# Patient Record
Sex: Female | Born: 1940 | Race: Black or African American | Hispanic: No | Smoking: Never smoker
Health system: Southern US, Community
[De-identification: ages and names within clinical notes are randomized; demographics above are authoritative.]

## PROBLEM LIST (undated history)

## (undated) DIAGNOSIS — R9439 Abnormal result of other cardiovascular function study: Secondary | ICD-10-CM

## (undated) DIAGNOSIS — E079 Disorder of thyroid, unspecified: Secondary | ICD-10-CM

## (undated) DIAGNOSIS — I10 Essential (primary) hypertension: Secondary | ICD-10-CM

## (undated) DIAGNOSIS — I639 Cerebral infarction, unspecified: Secondary | ICD-10-CM

---

## 2016-02-19 ENCOUNTER — Encounter (HOSPITAL_COMMUNITY): Payer: Self-pay | Admitting: Emergency Medicine

## 2016-02-19 ENCOUNTER — Emergency Department (HOSPITAL_COMMUNITY): Payer: Medicare Other

## 2016-02-19 ENCOUNTER — Emergency Department (HOSPITAL_COMMUNITY)
Admission: EM | Admit: 2016-02-19 | Discharge: 2016-02-20 | Disposition: A | Discharge status: Home / Self Care | Payer: Medicare Other | Attending: Emergency Medicine | Admitting: Emergency Medicine

## 2016-02-19 DIAGNOSIS — E079 Disorder of thyroid, unspecified: Secondary | ICD-10-CM | POA: Insufficient documentation

## 2016-02-19 DIAGNOSIS — Z79899 Other long term (current) drug therapy: Secondary | ICD-10-CM | POA: Diagnosis not present

## 2016-02-19 DIAGNOSIS — I10 Essential (primary) hypertension: Secondary | ICD-10-CM | POA: Insufficient documentation

## 2016-02-19 DIAGNOSIS — Z8673 Personal history of transient ischemic attack (TIA), and cerebral infarction without residual deficits: Secondary | ICD-10-CM | POA: Diagnosis not present

## 2016-02-19 DIAGNOSIS — Z7982 Long term (current) use of aspirin: Secondary | ICD-10-CM | POA: Diagnosis not present

## 2016-02-19 DIAGNOSIS — R079 Chest pain, unspecified: Secondary | ICD-10-CM | POA: Diagnosis present

## 2016-02-19 HISTORY — DX: Disorder of thyroid, unspecified: E07.9

## 2016-02-19 HISTORY — DX: Essential (primary) hypertension: I10

## 2016-02-19 HISTORY — DX: Cerebral infarction, unspecified: I63.9

## 2016-02-19 LAB — D-DIMER, QUANTITATIVE (NOT AT ARMC): D DIMER QUANT: 0.43 ug{FEU}/mL (ref 0.00–0.50)

## 2016-02-19 LAB — BASIC METABOLIC PANEL
Anion gap: 9 (ref 5–15)
BUN: 27 mg/dL — AB (ref 6–20)
CO2: 21 mmol/L — ABNORMAL LOW (ref 22–32)
Calcium: 8.9 mg/dL (ref 8.9–10.3)
Chloride: 111 mmol/L (ref 101–111)
Creatinine, Ser: 1.14 mg/dL — ABNORMAL HIGH (ref 0.44–1.00)
GFR calc Af Amer: 54 mL/min — ABNORMAL LOW (ref >60)
GFR, EST NON AFRICAN AMERICAN: 47 mL/min — AB (ref >60)
GLUCOSE: 97 mg/dL (ref 65–99)
POTASSIUM: 4 mmol/L (ref 3.5–5.1)
Sodium: 141 mmol/L (ref 135–145)

## 2016-02-19 LAB — I-STAT TROPONIN, ED: Troponin i, poc: 0 ng/mL (ref 0.00–0.08)

## 2016-02-19 LAB — CBC
HEMATOCRIT: 30.9 % — AB (ref 36.0–46.0)
Hemoglobin: 10.1 g/dL — ABNORMAL LOW (ref 12.0–15.0)
MCH: 25.5 pg — ABNORMAL LOW (ref 26.0–34.0)
MCHC: 32.7 g/dL (ref 30.0–36.0)
MCV: 78 fL (ref 78.0–100.0)
Platelets: 168 10*3/uL (ref 150–400)
RBC: 3.96 MIL/uL (ref 3.87–5.11)
RDW: 14.6 % (ref 11.5–15.5)
WBC: 5.4 10*3/uL (ref 4.0–10.5)

## 2016-02-19 LAB — TROPONIN I: Troponin I: <0.03 ng/mL (ref <0.031)

## 2016-02-19 MED ORDER — LABETALOL HCL 200 MG PO TABS
100.0000 mg | ORAL_TABLET | Freq: Once | ORAL | Status: AC
  Administered 2016-02-19: 100 mg via ORAL
  Filled 2016-02-19: qty 1

## 2016-02-19 NOTE — ED Notes (Signed)
Per EMS, pt just [place] back from [place] today to [place], and then drove back to [place], upon arrival at home she began to experience chest "tightness".  EMS gave her 324 ASA and 1 nitro that took her pain from a 5 to a 3.  Hx of stroke and HNT

## 2016-02-19 NOTE — ED Provider Notes (Signed)
CSN: [id number]     Arrival date & time 02/19/16  1919 History   First MD Initiated Contact with Patient 02/19/16 1929     Chief Complaint  Patient presents with  . Chest Pain     (Consider location/radiation/quality/duration/timing/severity/associated sxs/prior Treatment) HPI Comments: Patient is a 75 year old female with history of hypertension, hemorrhagic CVA, and thyroid disease. She presents for evaluation of chest discomfort. This started this evening after she returned home from a plane trip. She reports flying from [place] to [place], then driving from [place] to [place]. Shortly after arriving home, she spoke on the phone with her daughter and this is when her symptoms began. She describes her discomfort as a heaviness as if something sitting on her chest with no associated shortness of breath, nausea, diaphoresis, or radiation to the arm or jaw. This lasted for approximately an hour, then resolved after receiving aspirin and nitroglycerin by EMS.  She has no prior cardiac history. Her cardiac risk factors include hypertension.  Patient is a 75 y.o. female presenting with chest pain. The history is provided by the patient.  Chest Pain Pain location:  Substernal area Pain quality comment:  Heaviness Pain radiates to:  Does not radiate Pain radiates to the back: no   Pain severity:  Moderate Onset quality:  Sudden Duration:  1 hour Timing:  Constant Progression:  Resolved Chronicity:  New Relieved by:  Nitroglycerin Worsened by:  Nothing tried Ineffective treatments:  None tried Associated symptoms: no abdominal pain, no diaphoresis, no fever, no lower extremity edema, no nausea, no palpitations and no shortness of breath     Past Medical History  Diagnosis Date  . Hypertension   . Stroke (HCC)   . Thyroid disease    History reviewed. No pertinent past surgical history. No family history on file. Social History  Substance Use Topics  . Smoking status: None  .  Smokeless tobacco: None  . Alcohol Use: None   OB History    No data available     Review of Systems  Constitutional: Negative for fever and diaphoresis.  Respiratory: Negative for shortness of breath.   Cardiovascular: Positive for chest pain. Negative for palpitations.  Gastrointestinal: Negative for nausea and abdominal pain.  All other systems reviewed and are negative.     Allergies  Sulfa antibiotics  Home Medications   Prior to Admission medications   Medication Sig Start Date End Date Taking? Authorizing Provider  aspirin 81 MG tablet Take 81 mg by mouth daily.   Yes Historical Provider, MD  atorvastatin (LIPITOR) 40 MG tablet Take 40 mg by mouth daily.   Yes Historical Provider, MD  carvedilol (COREG) 25 MG tablet Take 25 mg by mouth 2 (two) times daily with a meal.   Yes Historical Provider, MD  levothyroxine (SYNTHROID, LEVOTHROID) 50 MCG tablet Take 50 mcg by mouth daily before breakfast.   Yes Historical Provider, MD  losartan (COZAAR) 50 MG tablet Take 50 mg by mouth 2 (two) times daily.   Yes Historical Provider, MD  NIFEdipine (PROCARDIA XL/ADALAT-CC) 90 MG 24 hr tablet Take 90 mg by mouth daily.   Yes Historical Provider, MD   There were no vitals taken for this visit. Physical Exam  Constitutional: She is oriented to person, place, and time. She appears well-developed and well-nourished. No distress.  HENT:  Head: Normocephalic and atraumatic.  Neck: Normal range of motion. Neck supple.  Cardiovascular: Normal rate and regular rhythm.  Exam reveals no gallop and no friction  rub.   No murmur heard. Pulmonary/Chest: Effort normal and breath sounds normal. No respiratory distress. She has no wheezes.  Abdominal: Soft. Bowel sounds are normal. She exhibits no distension. There is no tenderness.  Musculoskeletal: Normal range of motion. She exhibits no edema.  There is no edema or calf swelling. There is no calf tenderness and Homans sign is absent bilaterally.   Neurological: She is alert and oriented to person, place, and time.  Skin: Skin is warm and dry. She is not diaphoretic.  Nursing note and vitals reviewed.   ED Course  Procedures (including critical care time) Labs Review Labs Reviewed  BASIC METABOLIC PANEL  CBC  TROPONIN I  D-DIMER, QUANTITATIVE (NOT AT [facility])    Imaging Review No results found. I have personally reviewed and evaluated these images and lab results as part of my medical decision-making.   EKG Interpretation   Date/Time:  Wednesday February 19 2016 19:26:43 EDT Ventricular Rate:  57 PR Interval:  178 QRS Duration: 106 QT Interval:  455 QTC Calculation: 443 R Axis:   -32 Text Interpretation:  Sinus rhythm Abnormal R-wave progression, early  transition Left ventricular hypertrophy Nonspecific T abnormalities,  diffuse leads Confirmed by Kao Berkheimer  MD, Shakerra Red ([id number]) on 02/19/2016 7:31:12  PM      MDM   Final diagnoses:  None    Patient presents here with complaints of chest discomfort that started after arriving home from a plane trip. She has a negative d-dimer, EKG showing no acute change, and negative troponin 2. She has been pain-free throughout the emergency department stay. On multiple occasions, the patient requested to be discharged prior to the results of her tests. I was able to convince her to stay for the second troponin which did return negative. She will be discharged with instructions to follow-up with cardiology to discuss a stress test and return to the ER if her symptoms significantly worsen or change.    Geoffery Lyonsouglas Khye Hochstetler, MD 02/20/16 [phone]

## 2016-02-19 NOTE — Discharge Instructions (Signed)
Follow-up with cardiology to discuss a possible stress test. The number for the [facility] cardi[facility] clinic has been provided in this discharge summary for you to call and arrange this appointment.  Turn to the emergency department if symptoms significantly worsen or change.   Nonspecific Chest Pain  Chest pain can be caused by many different conditions. There is always a chance that your pain could be related to something serious, such as a heart attack or a blood clot in your lungs. Chest pain can also be caused by conditions that are not life-threatening. If you have chest pain, it is very important to follow up with your health care provider. CAUSES  Chest pain can be caused by:  Heartburn.  Pneumonia or bronchitis.  Anxiety or stress.  Inflammation around your heart (pericarditis) or lung (pleuritis or pleurisy).  A blood clot in your lung.  A collapsed lung (pneumothorax). It can develop suddenly on its own (spontaneous pneumothorax) or from trauma to the chest.  Shingles infection (varicella-zoster virus).  Heart attack.  Damage to the bones, muscles, and cartilage that make up your chest wall. This can include:  Bruised bones due to injury.  Strained muscles or cartilage due to frequent or repeated coughing or overwork.  Fracture to one or more ribs.  Sore cartilage due to inflammation (costochondritis). RISK FACTORS  Risk factors for chest pain may include:  Activities that increase your risk for trauma or injury to your chest.  Respiratory infections or conditions that cause frequent coughing.  Medical conditions or overeating that can cause heartburn.  Heart disease or family history of heart disease.  Conditions or health behaviors that increase your risk of developing a blood clot.  Having had chicken pox (varicella zoster). SIGNS AND SYMPTOMS Chest pain can feel like:  Burning or tingling on the surface of your chest or deep in your  chest.  Crushing, pressure, aching, or squeezing pain.  Dull or sharp pain that is worse when you move, cough, or take a deep breath.  Pain that is also felt in your back, neck, shoulder, or arm, or pain that spreads to any of these areas. Your chest pain may come and go, or it may stay constant. DIAGNOSIS Lab tests or other studies may be needed to find the cause of your pain. Your health care provider may have you take a test called an ambulatory ECG (electrocardiogram). An ECG records your heartbeat patterns at the time the test is performed. You may also have other tests, such as:  Transthoracic echocardiogram (TTE). During echocardiography, sound waves are used to create a picture of all of the heart structures and to look at how blood flows through your heart.  Transesophageal echocardiogram (TEE).This is a more advanced imaging test that obtains images from inside your body. It allows your health care provider to see your heart in finer detail.  Cardiac monitoring. This allows your health care provider to monitor your heart rate and rhythm in real time.  Holter monitor. This is a portable device that records your heartbeat and can help to diagnose abnormal heartbeats. It allows your health care provider to track your heart activity for several days, if needed.  Stress tests. These can be done through exercise or by taking medicine that makes your heart beat more quickly.  Blood tests.  Imaging tests. TREATMENT  Your treatment depends on what is causing your chest pain. Treatment may include:  Medicines. These may include:  Acid blockers for heartburn.  Anti-inflammatory  medicine.  Pain medicine for inflammatory conditions.  Antibiotic medicine, if an infection is present.  Medicines to dissolve blood clots.  Medicines to treat coronary artery disease.  Supportive care for conditions that do not require medicines. This may include:  Resting.  Applying heat or cold  packs to injured areas.  Limiting activities until pain decreases. HOME CARE INSTRUCTIONS  If you were prescribed an antibiotic medicine, finish it all even if you start to feel better.  Avoid any activities that bring on chest pain.  Do not use any tobacco products, including cigarettes, chewing tobacco, or electronic cigarettes. If you need help quitting, ask your health care provider.  Do not drink alcohol.  Take medicines only as directed by your health care provider.  Keep all follow-up visits as directed by your health care provider. This is important. This includes any further testing if your chest pain does not go away.  If heartburn is the cause for your chest pain, you may be told to keep your head raised (elevated) while sleeping. This reduces the chance that acid will go from your stomach into your esophagus.  Make lifestyle changes as directed by your health care provider. These may include:  Getting regular exercise. Ask your health care provider to suggest some activities that are safe for you.  Eating a heart-healthy diet. A registered dietitian can help you to learn healthy eating options.  Maintaining a healthy weight.  Managing diabetes, if necessary.  Reducing stress. SEEK MEDICAL CARE IF:  Your chest pain does not go away after treatment.  You have a rash with blisters on your chest.  You have a fever. SEEK IMMEDIATE MEDICAL CARE IF:   Your chest pain is worse.  You have an increasing cough, or you cough up blood.  You have severe abdominal pain.  You have severe weakness.  You faint.  You have chills.  You have sudden, unexplained chest discomfort.  You have sudden, unexplained discomfort in your arms, back, neck, or jaw.  You have shortness of breath at any time.  You suddenly start to sweat, or your skin gets clammy.  You feel nauseous or you vomit.  You suddenly feel light-headed or dizzy.  Your heart begins to beat quickly, or  it feels like it is skipping beats. These symptoms may represent a serious problem that is an emergency. Do not wait to see if the symptoms will go away. Get medical help right away. Call your local emergency services (911 in the [place] Do not drive yourself to the hospital.   This information is not intended to replace advice given to you by your health care provider. Make sure you discuss any questions you have with your health care provider.   Document Released: 08/12/2005 Document Revised: 11/23/2014 Document Reviewed: 06/08/2014 Elsevier Interactive Patient Education [employer].

## 2016-02-19 NOTE — ED Notes (Signed)
MD notified of BP>200.

## 2016-02-26 ENCOUNTER — Ambulatory Visit (INDEPENDENT_AMBULATORY_CARE_PROVIDER_SITE_OTHER): Payer: Medicare Other | Admitting: Cardiology

## 2016-02-26 ENCOUNTER — Encounter: Payer: Self-pay | Admitting: Cardiology

## 2016-02-26 VITALS — BP 142/82 | HR 58 | Ht 66.0 in | Wt 216.2 lb

## 2016-02-26 DIAGNOSIS — R079 Chest pain, unspecified: Secondary | ICD-10-CM

## 2016-02-26 NOTE — Progress Notes (Signed)
Cardiology Office Note   Date:  02/26/2016   ID:  Samantha Simon, DOB 05/03/1941, MRN [id number]  PCP:  No primary care provider on file.  Cardiologist:   Antonyo Hinderer Jorja LoaMartin Anniece Bleiler, MD    Chief Complaint  Patient presents with  . Chest Pain     History of Present Illness: Samantha Simon is a 75 y.o. female who presents today for cardiology evaluation.   She has a history of HTN, hemorrhagic CVA, and thyroid disease.  She presented to the ER on 4/5 with chest pain.  The pain occurred when she was returning home from a plane trip.  When she arrived at home, she had heaviness kn her chest without nausea, diaphoresis, or radiation to the arm or jaw.  The pain lasted an hour.  In the ER, she had troponin negative x2.  Today she feels well. She has not had any recurrence of her chest pain. She says that she thinks that the chest pain was due to missing medications when she was on vacation. She does say that she took her carvedilol and losartan when she was on vacation. She says that the pain was a pressure type sensation in the middle of her chest associated with shortness of breath. There were no exacerbating or alleviating factors. She actually says that when she received nitroglycerin in the emergency room it did not help her pain.  Today, she denies symptoms of palpitations, orthopnea, PND, lower extremity edema, claudication, dizziness, presyncope, syncope, bleeding, or neurologic sequela. The patient is tolerating medications without difficulties and is otherwise without complaint today.    Past Medical History  Diagnosis Date  . Hypertension   . Stroke (HCC)   . Thyroid disease    No past surgical history on file.   Current Outpatient Prescriptions  Medication Sig Dispense Refill  . aspirin 81 MG tablet Take 81 mg by mouth daily.    Marland Kitchen. atorvastatin (LIPITOR) 40 MG tablet Take 40 mg by mouth daily.    . carvedilol (COREG) 25 MG tablet Take 25 mg by mouth 2 (two) times daily with a meal.     . levothyroxine (SYNTHROID, LEVOTHROID) 50 MCG tablet Take 50 mcg by mouth daily before breakfast.    . losartan (COZAAR) 50 MG tablet Take 50 mg by mouth 2 (two) times daily.    Marland Kitchen. NIFEdipine (PROCARDIA XL/ADALAT-CC) 90 MG 24 hr tablet Take 90 mg by mouth daily.     No current facility-administered medications for this visit.    Allergies:   Sulfa antibiotics   Social History:  The patient  reports that she has never smoked. She does not have any smokeless tobacco history on file. She reports that she drinks alcohol. She reports that she does not use illicit drugs.   Family History:  The patient's family history includes Fainting in her mother; Heart attack in her father; Hypertension in her father; Stroke in her maternal grandfather.    ROS:  Please see the history of present illness.   Otherwise, review of systems is positive for back pain, headaches.   All other systems are reviewed and negative.    PHYSICAL EXAM: VS:  BP 142/82 mmHg  Pulse 58  Ht 5\' 6"  (1.676 m)  Wt 216 lb 3.2 oz (98.068 kg)  BMI 34.91 kg/m2 , BMI Body mass index is 34.91 kg/(m^2). GEN: Well nourished, well developed, in no acute distress HEENT: normal Neck: no JVD, carotid bruits, or masses Cardiac: RRR; no rubs, or gallops,no edema, 2/6  systolic murmur at the base Respiratory:  clear to auscultation bilaterally, normal work of breathing GI: soft, nontender, nondistended, + BS MS: no deformity or atrophy Skin: warm and dry Neuro:  Strength and sensation are intact Psych: euthymic mood, full affect  EKG:  EKG is not ordered today. The ekg ordered 4/7 shows sinus rhythm, PRWP, inferior TWI  Recent Labs: 02/19/2016: BUN 27*; Creatinine, Ser 1.14*; Hemoglobin 10.1*; Platelets 168; Potassium 4.0; Sodium 141    Lipid Panel  No results found for: CHOL, TRIG, HDL, CHOLHDL, VLDL, LDLCALC, LDLDIRECT   Wt Readings from Last 3 Encounters:  02/26/16 216 lb 3.2 oz (98.068 kg)   ASSESSMENT AND PLAN:  1.   Chest pain:the chest pain has some both typical and atypical features. It is associated with both chest heaviness and shortness of breath. It is not relieved by nitroglycerin. Due to the both typical and atypical features, we'll order a nuclear stress test to determine if she does have coronary disease.   Current medicines are reviewed at length with the patient today.   The patient does not have concerns regarding her medicines.  The following changes were made today:  none  Labs/ tests ordered today include:  Orders Placed This Encounter  Procedures  . Myocardial Perfusion Imaging     Disposition:   FU with Tirth Cothron pending nuclear study  Signed, Harvie Morua Jorja Loa, MD  02/26/2016 12:32 PM     [facility] [address] [place] [address] [phone] (office) [phone] (fax)

## 2016-02-26 NOTE — Patient Instructions (Signed)
Medication Instructions:  The current medical regimen is effective;  continue present plan and medications.  Testing/Procedures: Your physician has requested that you have a lexiscan myoview. For further information please visit [email or web address]. Please follow instruction sheet, as given.  Follow-Up: Follow up as needed after testing.  If you need a refill on your cardiac medications before your next appointment, please call your pharmacy.  Thank you for choosing [facility]!!      

## 2016-03-02 ENCOUNTER — Ambulatory Visit: Payer: Medicare Other | Admitting: Cardiology

## 2016-03-03 ENCOUNTER — Telehealth (HOSPITAL_COMMUNITY): Payer: Self-pay | Admitting: *Deleted

## 2016-03-03 NOTE — Telephone Encounter (Signed)
Patient given detailed instructions per Myocardial Perfusion Study Information Sheet for the test on 03/06/16. Patient notified to arrive 15 minutes early and that it is imperative to arrive on time for appointment to keep from having the test rescheduled.  If you need to cancel or reschedule your appointment, please call the office within 24 hours of your appointment. Failure to do so may result in a cancellation of your appointment, and a $50 no show fee. Patient verbalized understanding. Khalessi Blough J Normalee Sistare, RN  

## 2016-03-06 ENCOUNTER — Ambulatory Visit (HOSPITAL_COMMUNITY): Payer: Medicare Other | Attending: Cardiology

## 2016-03-06 DIAGNOSIS — I10 Essential (primary) hypertension: Secondary | ICD-10-CM | POA: Diagnosis not present

## 2016-03-06 DIAGNOSIS — R079 Chest pain, unspecified: Secondary | ICD-10-CM | POA: Diagnosis present

## 2016-03-06 DIAGNOSIS — R9439 Abnormal result of other cardiovascular function study: Secondary | ICD-10-CM | POA: Diagnosis not present

## 2016-03-06 DIAGNOSIS — R0602 Shortness of breath: Secondary | ICD-10-CM | POA: Insufficient documentation

## 2016-03-06 LAB — MYOCARDIAL PERFUSION IMAGING
CHL CUP NUCLEAR SRS: 2
CHL CUP RESTING HR STRESS: 68 {beats}/min
LV sys vol: 88 mL
LVDIAVOL: 150 mL (ref 46–106)
NUC STRESS TID: 0.95
Peak HR: 81 {beats}/min
RATE: 0.26
SDS: 7
SSS: 9

## 2016-03-06 MED ORDER — TECHNETIUM TC 99M SESTAMIBI GENERIC - CARDIOLITE
32.3000 | Freq: Once | INTRAVENOUS | Status: AC | PRN
  Administered 2016-03-06: 32.3 via INTRAVENOUS

## 2016-03-06 MED ORDER — TECHNETIUM TC 99M SESTAMIBI GENERIC - CARDIOLITE
10.2000 | Freq: Once | INTRAVENOUS | Status: AC | PRN
  Administered 2016-03-06: 10 via INTRAVENOUS

## 2016-03-06 MED ORDER — REGADENOSON 0.4 MG/5ML IV SOLN
0.4000 mg | Freq: Once | INTRAVENOUS | Status: AC
  Administered 2016-03-06: 0.4 mg via INTRAVENOUS

## 2016-03-13 ENCOUNTER — Telehealth: Payer: Self-pay | Admitting: Cardiology

## 2016-03-13 NOTE — Telephone Encounter (Signed)
ERROR

## 2016-03-22 NOTE — Progress Notes (Signed)
Cardiology Office Note   Date:  03/23/2016   ID:  Samantha Simon, DOB August 12, 1941, MRN [id number]  PCP:  No primary care provider on file.  Cardiologist:   Samantha Wooden Jorja Loa, MD    Chief Complaint  Patient presents with  . Follow-up    discuss cath     History of Present Illness: Samantha Simon is a 75 y.o. female who presents today for cardiology evaluation.   She has a history of HTN, hemorrhagic CVA, and thyroid disease.  She had a stress myoview showing EF 41% with possible apical infarct vs diaphragmatic attenuation.  She has been feeling well since last being seen, but she does continue to have episodes of chest pain or similar to her chest pain prior to her stress test.  Today, she denies symptoms of palpitations, orthopnea, PND, lower extremity edema, claudication, dizziness, presyncope, syncope, bleeding, or neurologic sequela. The patient is tolerating medications without difficulties and is otherwise without complaint today.    Past Medical History  Diagnosis Date  . Hypertension   . Stroke (HCC)   . Thyroid disease    No past surgical history on file.   Current Outpatient Prescriptions  Medication Sig Dispense Refill  . aspirin 81 MG tablet Take 81 mg by mouth daily.    Marland Kitchen atorvastatin (LIPITOR) 40 MG tablet Take 40 mg by mouth daily.    . carvedilol (COREG) 25 MG tablet Take 25 mg by mouth 2 (two) times daily with a meal.    . levothyroxine (SYNTHROID, LEVOTHROID) 50 MCG tablet Take 50 mcg by mouth daily before breakfast.    . losartan (COZAAR) 50 MG tablet Take 50 mg by mouth 2 (two) times daily.    Marland Kitchen NIFEdipine (PROCARDIA XL/ADALAT-CC) 90 MG 24 hr tablet Take 90 mg by mouth daily.     No current facility-administered medications for this visit.    Allergies:   Sulfa antibiotics   Social History:  The patient  reports that she has never smoked. She does not have any smokeless tobacco history on file. She reports that she drinks alcohol. She reports that  she does not use illicit drugs.   Family History:  The patient's family history includes Fainting in her mother; Heart attack in her father; Hypertension in her father; Stroke in her maternal grandfather.    ROS:  Please see the history of present illness.   Otherwise, review of systems is positive for back pain, chest pain, neurology headaches.   All other systems are reviewed and negative.    PHYSICAL EXAM: VS:  BP 172/90 mmHg  Pulse 64  Ht  (1.651 m)  Wt 216 lb (97.977 kg)  BMI 35.94 kg/m2 , BMI Body mass index is 35.94 kg/(m^2). GEN: Well nourished, well developed, in no acute distress HEENT: normal Neck: no JVD, carotid bruits, or masses Cardiac: RRR; no rubs, or gallops,no edema, 2/6 systolic Simon at the base Respiratory:  clear to auscultation bilaterally, normal work of breathing GI: soft, nontender, nondistended, + BS MS: no deformity or atrophy Skin: warm and dry Neuro:  Strength and sensation are intact Psych: euthymic mood, full affect  EKG:  EKG is not ordered today. The ekg ordered 4/7 shows sinus rhythm, PRWP, inferior TWI  Recent Labs: 02/19/2016: BUN 27*; Creatinine, Ser 1.14*; Hemoglobin 10.1*; Platelets 168; Potassium 4.0; Sodium 141    Lipid Panel  No results found for: CHOL, TRIG, HDL, CHOLHDL, VLDL, LDLCALC, LDLDIRECT   Wt Readings from Last 3 Encounters:  03/23/16  216 lb (97.977 kg)  02/26/16 216 lb 3.2 oz (98.068 kg)   SPECT 03/06/16  Nuclear stress EF: 41%.  There was no ST segment deviation noted during stress.  This is an intermediate risk study.  The left ventricular ejection fraction is moderately decreased (30-44%).  ASSESSMENT AND PLAN:  1.  Chest pain: SPECT with possible infarct vs diaphragm attenuation. Due to the fact that she has a decreased EF on her SPECT, we'll get an echocardiogram to further delineate. She also has a Simon and that Samantha Simon. She is continuing to have chest  discomfort that is similar to her prior episodes of chest discomfort. She had an intermediate risk study with an apical inferolateral perfusion defect. We'll therefore get a heart catheterization to see if she has any coronary disease that could explain her chest pain as well as her decreased EF.   Current medicines are reviewed at length with the patient today.   The patient does not have concerns regarding her medicines.  The following changes were made today:    Labs/ tests ordered today include:  No orders of the defined types were placed in this encounter.     Disposition:   FU with Samantha Simon post cath  Signed, Samantha Munger Jorja LoaMartin Travone Georg, MD  03/23/2016 2:58 PM     [facility] [address] [place] [address] [phone] (office) [phone] (fax)

## 2016-03-23 ENCOUNTER — Encounter: Payer: Self-pay | Admitting: *Deleted

## 2016-03-23 ENCOUNTER — Ambulatory Visit (INDEPENDENT_AMBULATORY_CARE_PROVIDER_SITE_OTHER): Payer: Medicare Other | Admitting: Cardiology

## 2016-03-23 ENCOUNTER — Encounter: Payer: Self-pay | Admitting: Cardiology

## 2016-03-23 VITALS — BP 172/90 | HR 64 | Ht 65.0 in | Wt 216.0 lb

## 2016-03-23 DIAGNOSIS — R9439 Abnormal result of other cardiovascular function study: Secondary | ICD-10-CM

## 2016-03-23 DIAGNOSIS — Z01812 Encounter for preprocedural laboratory examination: Secondary | ICD-10-CM

## 2016-03-23 DIAGNOSIS — R079 Chest pain, unspecified: Secondary | ICD-10-CM | POA: Diagnosis not present

## 2016-03-23 NOTE — Patient Instructions (Addendum)
Medication Instructions:  Your physician has recommended you make the following change in your medication:  1) INCREASE Losartan to 100 mg daily  Labwork: Your physician recommends that you return for lab work in: next week for BMET, CBCD  Testing/Procedures: Your physician has requested that you have a cardiac catheterization. Cardiac catheterization is used to diagnose and/or treat various heart conditions. Doctors may recommend this procedure for a number of different reasons. The most common reason is to evaluate chest pain. Chest pain can be a symptom of coronary artery disease (CAD), and cardiac catheterization can show whether plaque is narrowing or blocking your heart's arteries. This procedure is also used to evaluate the valves, as well as measure the blood flow and oxygen levels in different parts of your heart. For further information please visit [email or web address]. Please follow instruction sheet, as given.  Your physician has requested that you have an echocardiogram. Echocardiography is a painless test that uses sound waves to create images of your heart. It provides your doctor with information about the size and shape of your heart and how well your heart's chambers and valves are working. This procedure takes approximately one hour. There are no restrictions for this procedure.  Follow-Up: Your physician recommends that you schedule a follow-up appointment in: with Dr. Elberta Fortisamnitz after your catheterization on 04/01/16.   If you need a refill on your cardiac medications before your next appointment, please call your pharmacy.  Thank you for choosing [facility]!!   Dory HornSherri Neidra Girvan, RN [phone]   Any Other Special Instructions Will Be Listed Below (If Applicable). Coronary Angiogram A coronary angiogram, also called coronary angiography, is an X-ray procedure used to look at the arteries in the heart. In this procedure, a dye (contrast dye) is injected through a long,  hollow tube (catheter). The catheter is about the size of a piece of cooked spaghetti and is inserted through your groin, wrist, or arm. The dye is injected into each artery, and X-rays are then taken to show if there is a blockage in the arteries of your heart. LET [facility] PROVID[facility] KNOW ABOUT:  Any allergies you have, including allergies to shellfish or contrast dye.   All medicines you are taking, including vitamins, herbs, eye drops, creams, and over-the-counter medicines.   Previous problems you or members of your family have had with the use of anesthetics.   Any blood disorders you have.   Previous surgeries you have had.  History of kidney problems or failure.   Other medical conditions you have. RISKS AND COMPLICATIONS  Generally, a coronary angiogram is a safe procedure. However, problems can occur and include:  Allergic reaction to the dye.  Bleeding from the access site or other locations.  Kidney injury, especially in people with impaired kidney function.  Stroke (rare).  Heart attack (rare). BEFORE THE PROCEDURE   Do not eat or drink anything after midnight the night before the procedure or as directed by your health care provider.   Ask your health care provider about changing or stopping your regular medicines. This is especially important if you are taking diabetes medicines or blood thinners. PROCEDURE  You may be given a medicine to help you relax (sedative) before the procedure. This medicine is given through an intravenous (IV) access tube that is inserted into one of your veins.   The area where the catheter will be inserted will be washed and shaved. This is usually done in the groin but may be done in  the fold of your arm (near your elbow) or in the wrist.   A medicine will be given to numb the area where the catheter will be inserted (local anesthetic).   The health care provider will insert the catheter into an artery. The catheter  will be guided by using a special type of X-ray (fluoroscopy) of the blood vessel being examined.   A special dye will then be injected into the catheter, and X-rays will be taken. The dye will help to show where any narrowing or blockages are located in the heart arteries.  AFTER THE PROCEDURE   If the procedure is done through the leg, you will be kept in bed lying flat for several hours. You will be instructed to not bend or cross your legs.  The insertion site will be checked frequently.   The pulse in your feet or wrist will be checked frequently.   Additional blood tests, X-rays, and an electrocardiogram may be done.    This information is not intended to replace advice given to you by your health care provider. Make sure you discuss any questions you have with your health care provider.   Document Released: 05/09/2003 Document Revised: 11/23/2014 Document Reviewed: 03/27/2013 Elsevier Interactive Patient Education [employer]

## 2016-03-24 ENCOUNTER — Telehealth: Payer: Self-pay | Admitting: *Deleted

## 2016-03-24 NOTE — Telephone Encounter (Signed)
Dentist called and wants Dr Elberta Fortisamnitz to know patient has active dental infection with purulent drainage.   She was started on Clindamycin today.  She just wanted Dr Elberta Fortisamnitz to know as the patient states she is having a heart cath.  The cath is scheduled for 04/01/16.  MD questioned if cath needed to be postponed until dental issues are better.  I let her know I would forward to Dr  Elberta Fortisamnitz for recommendation and his nurse Dory HornSherri Price will be in touch with the patient

## 2016-03-24 NOTE — Telephone Encounter (Signed)
Discussed with Dr Elberta Fortisamnitz, okay to postpone cath until dental issues are resolved.  Will forward to Dory HornSherri Price to call patient and dentist office to see when they feel ok to reschedule

## 2016-03-25 ENCOUNTER — Telehealth: Payer: Self-pay | Admitting: Cardiology

## 2016-03-25 ENCOUNTER — Other Ambulatory Visit: Payer: Self-pay | Admitting: *Deleted

## 2016-03-25 MED ORDER — LOSARTAN POTASSIUM 50 MG PO TABS: 100.0000 mg | ORAL_TABLET | Freq: Two times a day (BID) | ORAL | Status: DC

## 2016-03-25 NOTE — Telephone Encounter (Signed)
Pt returned your call to discuss plans to reschedule Cath-pls call @  [phone]

## 2016-03-25 NOTE — Telephone Encounter (Signed)
Informed dentist office ok to proceed w/ dental work and cath will be postponed until completed. Attempted to reach patient to discuss recommendations and plans to reschedule cath. lmtcb

## 2016-03-25 NOTE — Telephone Encounter (Signed)
Pt informed ok to have dental work and we will reschedule cath. Pt and I agreed to speak next week to determine when cath can be rescheduled. She will call dentist office to make arrangements.

## 2016-03-25 NOTE — Progress Notes (Signed)
OV faxed to PCP - Everrett CoombeJessica Blydenburgh, [facility] [place], [place]. Faxed to [phone]

## 2016-03-25 NOTE — Telephone Encounter (Signed)
*  STAT* If patient is at the pharmacy, call can be transferred to refill team.  Needs refill after med increase   1. Which medications need to be refilled? (please list name of each medication and dose if known) Losartan 100 mg  2. Which pharmacy/location (including street and city if local pharmacy) is [facility] to be [facility] [place] [phone]  3. Do they need a 30 day or 90 day supply? 90

## 2016-03-31 ENCOUNTER — Other Ambulatory Visit: Payer: Medicare Other

## 2016-04-01 ENCOUNTER — Encounter (HOSPITAL_COMMUNITY): Admission: RE | Payer: Self-pay | Source: Ambulatory Visit

## 2016-04-01 ENCOUNTER — Encounter: Payer: Self-pay | Admitting: *Deleted

## 2016-04-01 ENCOUNTER — Telehealth: Payer: Self-pay | Admitting: *Deleted

## 2016-04-01 ENCOUNTER — Ambulatory Visit (HOSPITAL_COMMUNITY)
Admission: RE | Admit: 2016-04-01 | Payer: Medicare Other | Source: Ambulatory Visit | Admitting: Interventional Cardiology

## 2016-04-01 DIAGNOSIS — Z01812 Encounter for preprocedural laboratory examination: Secondary | ICD-10-CM

## 2016-04-01 DIAGNOSIS — R9439 Abnormal result of other cardiovascular function study: Secondary | ICD-10-CM

## 2016-04-01 SURGERY — LEFT HEART CATH AND CORONARY ANGIOGRAPHY

## 2016-04-01 MED ORDER — ASPIRIN 81 MG PO TABS: 81.0000 mg | ORAL_TABLET | Freq: Every day | ORAL | Status: DC

## 2016-04-01 NOTE — Telephone Encounter (Signed)
See 5/17 telephone note for cath reschedule instructions

## 2016-04-01 NOTE — Telephone Encounter (Signed)
Follow Call   Mrs. Samantha Simon is calling to get the cath reschedule . Thanks

## 2016-04-01 NOTE — Telephone Encounter (Signed)
Cath scheduled for 6/01 w/ Dr. Excell Seltzerooper. Letter of instructions left at front desk for patient pick up. Pre procedure labs on 5/30. Patient tells me she cannot make echo scheduled for 5/23. Advised patient to call office when she is ready to reschedule and understands if cannot have echo before OV w/ Camntiz on 6/7 she will need to reschedule OV also. She understands echo needs to be completed before f/u w/ Camnitz. Patient verbalized understanding and agreeable to plan.

## 2016-04-02 ENCOUNTER — Other Ambulatory Visit: Payer: Self-pay | Admitting: Cardiology

## 2016-04-02 DIAGNOSIS — R079 Chest pain, unspecified: Secondary | ICD-10-CM

## 2016-04-07 ENCOUNTER — Other Ambulatory Visit (HOSPITAL_COMMUNITY): Payer: Medicare Other

## 2016-04-14 ENCOUNTER — Other Ambulatory Visit (INDEPENDENT_AMBULATORY_CARE_PROVIDER_SITE_OTHER): Payer: Medicare Other | Admitting: *Deleted

## 2016-04-14 DIAGNOSIS — R9439 Abnormal result of other cardiovascular function study: Secondary | ICD-10-CM

## 2016-04-14 DIAGNOSIS — Z01812 Encounter for preprocedural laboratory examination: Secondary | ICD-10-CM

## 2016-04-14 LAB — CBC WITH DIFFERENTIAL/PLATELET
BASOS ABS: 50 {cells}/uL (ref 0–200)
Basophils Relative: 1 %
EOS PCT: 4 %
Eosinophils Absolute: 200 cells/uL (ref 15–500)
HCT: 34.7 % — ABNORMAL LOW (ref 35.0–45.0)
Hemoglobin: 11.2 g/dL — ABNORMAL LOW (ref 11.7–15.5)
Lymphocytes Relative: 33 %
Lymphs Abs: 1650 cells/uL (ref 850–3900)
MCH: 26.3 pg — AB (ref 27.0–33.0)
MCHC: 32.3 g/dL (ref 32.0–36.0)
MCV: 81.5 fL (ref 80.0–100.0)
MONOS PCT: 7 %
MPV: 9.2 fL (ref 7.5–12.5)
Monocytes Absolute: 350 cells/uL (ref 200–950)
NEUTROS ABS: 2750 {cells}/uL (ref 1500–7800)
NEUTROS PCT: 55 %
PLATELETS: 201 10*3/uL (ref 140–400)
RBC: 4.26 MIL/uL (ref 3.80–5.10)
RDW: 15.3 % — AB (ref 11.0–15.0)
WBC: 5 10*3/uL (ref 3.8–10.8)

## 2016-04-14 LAB — BASIC METABOLIC PANEL
BUN: 23 mg/dL (ref 7–25)
CALCIUM: 9.4 mg/dL (ref 8.6–10.4)
CO2: 26 mmol/L (ref 20–31)
CREATININE: 1.02 mg/dL — AB (ref 0.60–0.93)
Chloride: 109 mmol/L (ref 98–110)
Glucose, Bld: 104 mg/dL — ABNORMAL HIGH (ref 65–99)
Potassium: 4.3 mmol/L (ref 3.5–5.3)
Sodium: 143 mmol/L (ref 135–146)

## 2016-04-16 ENCOUNTER — Ambulatory Visit (HOSPITAL_COMMUNITY): Admission: RE | Admit: 2016-04-16 | Payer: Medicare Other | Source: Ambulatory Visit | Admitting: Cardiovascular Disease

## 2016-04-16 ENCOUNTER — Telehealth: Payer: Self-pay | Admitting: *Deleted

## 2016-04-16 ENCOUNTER — Encounter (HOSPITAL_COMMUNITY): Admission: RE | Payer: Self-pay | Source: Ambulatory Visit

## 2016-04-16 SURGERY — LEFT HEART CATH AND CORONARY ANGIOGRAPHY

## 2016-04-16 NOTE — Telephone Encounter (Signed)
Called to give patient lab results. She would like to reschedule cath. We agreed I would call her Monday to arrange a date for cath.

## 2016-04-16 NOTE — Telephone Encounter (Addendum)
Informed patient cancelling next weeks appointment because she has not had her echo or cath. We will reschedule these next week when I call her to arrange cath. Patient verbalized understanding and agreeable to plan.

## 2016-04-20 NOTE — Telephone Encounter (Signed)
Cath scheduled for 6/8. Instructions reviewed with patient. Patient recently had lab work 5/30. Patient verbalized understanding and agreeable to plan.

## 2016-04-22 ENCOUNTER — Ambulatory Visit: Payer: Medicare Other | Admitting: Cardiology

## 2016-04-22 ENCOUNTER — Other Ambulatory Visit: Payer: Self-pay | Admitting: Cardiology

## 2016-04-23 ENCOUNTER — Ambulatory Visit (HOSPITAL_COMMUNITY)
Admission: RE | Admit: 2016-04-23 | Discharge: 2016-04-23 | Disposition: A | Discharge status: Home / Self Care | Payer: Medicare Other | Source: Ambulatory Visit | Attending: Cardiovascular Disease | Admitting: Cardiovascular Disease

## 2016-04-23 ENCOUNTER — Encounter (HOSPITAL_COMMUNITY)
Admission: RE | Disposition: A | Discharge status: Home / Self Care | Payer: Self-pay | Source: Ambulatory Visit | Attending: Cardiovascular Disease

## 2016-04-23 ENCOUNTER — Other Ambulatory Visit: Payer: Self-pay

## 2016-04-23 ENCOUNTER — Encounter (HOSPITAL_COMMUNITY): Payer: Self-pay | Admitting: *Deleted

## 2016-04-23 DIAGNOSIS — R079 Chest pain, unspecified: Secondary | ICD-10-CM

## 2016-04-23 DIAGNOSIS — Z8249 Family history of ischemic heart disease and other diseases of the circulatory system: Secondary | ICD-10-CM | POA: Diagnosis not present

## 2016-04-23 DIAGNOSIS — E079 Disorder of thyroid, unspecified: Secondary | ICD-10-CM | POA: Insufficient documentation

## 2016-04-23 DIAGNOSIS — Z7982 Long term (current) use of aspirin: Secondary | ICD-10-CM | POA: Insufficient documentation

## 2016-04-23 DIAGNOSIS — Z8673 Personal history of transient ischemic attack (TIA), and cerebral infarction without residual deficits: Secondary | ICD-10-CM | POA: Insufficient documentation

## 2016-04-23 DIAGNOSIS — R931 Abnormal findings on diagnostic imaging of heart and coronary circulation: Secondary | ICD-10-CM

## 2016-04-23 DIAGNOSIS — R9439 Abnormal result of other cardiovascular function study: Secondary | ICD-10-CM

## 2016-04-23 DIAGNOSIS — I10 Essential (primary) hypertension: Secondary | ICD-10-CM | POA: Insufficient documentation

## 2016-04-23 HISTORY — PX: CARDIAC CATHETERIZATION: SHX172

## 2016-04-23 HISTORY — DX: Abnormal result of other cardiovascular function study: R94.39

## 2016-04-23 LAB — PROTIME-INR
INR: 1.11 (ref 0.00–1.49)
Prothrombin Time: 14.5 seconds (ref 11.6–15.2)

## 2016-04-23 SURGERY — LEFT HEART CATH AND CORONARY ANGIOGRAPHY
Anesthesia: LOCAL

## 2016-04-23 MED ORDER — SODIUM CHLORIDE 0.9% FLUSH: 3.0000 mL | Freq: Two times a day (BID) | INTRAVENOUS | Status: DC

## 2016-04-23 MED ORDER — VERAPAMIL HCL 2.5 MG/ML IV SOLN
INTRAVENOUS | Status: DC | PRN
  Administered 2016-04-23: 10 mL via INTRA_ARTERIAL

## 2016-04-23 MED ORDER — ACETAMINOPHEN 325 MG PO TABS
650.0000 mg | ORAL_TABLET | ORAL | Status: DC | PRN
  Administered 2016-04-23: 650 mg via ORAL

## 2016-04-23 MED ORDER — SODIUM CHLORIDE 0.9 % WEIGHT BASED INFUSION
3.0000 mL/kg/h | INTRAVENOUS | Status: AC
Start: 2016-04-23 — End: 2016-04-23

## 2016-04-23 MED ORDER — HEPARIN (PORCINE) IN NACL 2-0.9 UNIT/ML-% IJ SOLN
INTRAMUSCULAR | Status: DC | PRN
  Administered 2016-04-23: 1000 mL

## 2016-04-23 MED ORDER — LIDOCAINE HCL (PF) 1 % IJ SOLN
INTRAMUSCULAR | Status: AC
  Filled 2016-04-23: qty 30

## 2016-04-23 MED ORDER — SODIUM CHLORIDE 0.9 % IV SOLN: 250.0000 mL | INTRAVENOUS | Status: DC | PRN

## 2016-04-23 MED ORDER — SODIUM CHLORIDE 0.9 % WEIGHT BASED INFUSION
3.0000 mL/kg/h | INTRAVENOUS | Status: DC
  Administered 2016-04-23: 3 mL/kg/h via INTRAVENOUS

## 2016-04-23 MED ORDER — FENTANYL CITRATE (PF) 100 MCG/2ML IJ SOLN
INTRAMUSCULAR | Status: DC | PRN
  Administered 2016-04-23: 25 ug via INTRAVENOUS

## 2016-04-23 MED ORDER — IOPAMIDOL (ISOVUE-370) INJECTION 76%
INTRAVENOUS | Status: DC | PRN
  Administered 2016-04-23: 70 mL via INTRA_ARTERIAL

## 2016-04-23 MED ORDER — HEPARIN (PORCINE) IN NACL 2-0.9 UNIT/ML-% IJ SOLN
INTRAMUSCULAR | Status: AC
  Filled 2016-04-23: qty 1000

## 2016-04-23 MED ORDER — HEPARIN SODIUM (PORCINE) 1000 UNIT/ML IJ SOLN
INTRAMUSCULAR | Status: AC
  Filled 2016-04-23: qty 1

## 2016-04-23 MED ORDER — VERAPAMIL HCL 2.5 MG/ML IV SOLN
INTRAVENOUS | Status: AC
  Filled 2016-04-23: qty 2

## 2016-04-23 MED ORDER — MIDAZOLAM HCL 2 MG/2ML IJ SOLN
INTRAMUSCULAR | Status: AC
  Filled 2016-04-23: qty 2

## 2016-04-23 MED ORDER — SODIUM CHLORIDE 0.9 % WEIGHT BASED INFUSION: 1.0000 mL/kg/h | INTRAVENOUS | Status: DC

## 2016-04-23 MED ORDER — IOPAMIDOL (ISOVUE-370) INJECTION 76%
INTRAVENOUS | Status: AC
  Filled 2016-04-23: qty 100

## 2016-04-23 MED ORDER — HEPARIN SODIUM (PORCINE) 1000 UNIT/ML IJ SOLN
INTRAMUSCULAR | Status: DC | PRN
  Administered 2016-04-23: 5000 [IU] via INTRAVENOUS

## 2016-04-23 MED ORDER — SODIUM CHLORIDE 0.9% FLUSH: 3.0000 mL | INTRAVENOUS | Status: DC | PRN

## 2016-04-23 MED ORDER — MIDAZOLAM HCL 2 MG/2ML IJ SOLN
INTRAMUSCULAR | Status: DC | PRN
  Administered 2016-04-23: 2 mg via INTRAVENOUS

## 2016-04-23 MED ORDER — LIDOCAINE HCL (PF) 1 % IJ SOLN
INTRAMUSCULAR | Status: DC | PRN
  Administered 2016-04-23: 4 mL via INTRADERMAL

## 2016-04-23 MED ORDER — ASPIRIN 81 MG PO CHEW: 81.0000 mg | CHEWABLE_TABLET | ORAL | Status: DC

## 2016-04-23 MED ORDER — FENTANYL CITRATE (PF) 100 MCG/2ML IJ SOLN
INTRAMUSCULAR | Status: AC
  Filled 2016-04-23: qty 2

## 2016-04-23 MED ORDER — ONDANSETRON HCL 4 MG/2ML IJ SOLN: 4.0000 mg | Freq: Four times a day (QID) | INTRAMUSCULAR | Status: DC | PRN

## 2016-04-23 MED ORDER — ACETAMINOPHEN 325 MG PO TABS
ORAL_TABLET | ORAL | Status: AC
  Administered 2016-04-23: 650 mg via ORAL
  Filled 2016-04-23: qty 2

## 2016-04-23 SURGICAL SUPPLY — 12 items

## 2016-04-23 NOTE — Research (Signed)
CADLAD Informed Consent   Subject Name: Samantha Simon  Subject met inclusion and exclusion criteria.  The informed consent form, study requirements and expectations were reviewed with the subject and questions and concerns were addressed prior to the signing of the consent form.  The subject verbalized understanding of the trail requirements.  The subject agreed to participate in the CADLAD trial and signed the informed consent.  The informed consent was obtained prior to performance of any protocol-specific procedures for the subject.  A copy of the signed informed consent was given to the subject and a copy was placed in the subject's medical record.  [name] 04/23/2016, 11:22 AM

## 2016-04-23 NOTE — Progress Notes (Addendum)
I am unable to contact her ride and caregiver for the evening which is her ex husband// keith.  Per pt he has an etoh problem and is probably sleeping. I  left a msg for her daughter Elnita MaxwellCheryl (lives out of state) for  [place] home number that I could try calling. Pt gave her cell phone to her ex husband and has no access to numbers. I called [place] for cardiology to seek advise. Order for social service was placed, I spoke with them and they can provide a taxi voucher. I called PA [place] back and she will speak with Dr Excell Seltzerooper and call back with further recommendations.  Call was returned from [place] / per Dr Excell Seltzerooper pt may go home if wrist is stable with a Taxi voucher to drive her home.

## 2016-04-23 NOTE — H&P (Signed)
Will Jorja LoaMartin Camnitz, MD at 03/22/2016 9:37 PM     Status: Signed       Expand All Collapse All      Cardiology Office Note   Date: 03/23/2016   ID: Samantha Simon, DOB Jan 18, 1941, MRN [id number]  PCP: No primary care provider on file. Cardiologist: Will Jorja LoaMartin Camnitz, MD   Chief Complaint  Patient presents with  . Follow-up    discuss cath    History of Present Illness: Samantha Simon is a 75 y.o. female who presents today for cardiology evaluation. She has a history of HTN, hemorrhagic CVA, and thyroid disease. She had a stress myoview showing EF 41% with possible apical infarct vs diaphragmatic attenuation. She has been feeling well since last being seen, but she does continue to have episodes of chest pain or similar to her chest pain prior to her stress test.  Today, she denies symptoms of palpitations, orthopnea, PND, lower extremity edema, claudication, dizziness, presyncope, syncope, bleeding, or neurologic sequela. The patient is tolerating medications without difficulties and is otherwise without complaint today.    Past Medical History  Diagnosis Date  . Hypertension   . Stroke (HCC)   . Thyroid disease    No past surgical history on file.   Current Outpatient Prescriptions  Medication Sig Dispense Refill  . aspirin 81 MG tablet Take 81 mg by mouth daily.    Marland Kitchen. atorvastatin (LIPITOR) 40 MG tablet Take 40 mg by mouth daily.    . carvedilol (COREG) 25 MG tablet Take 25 mg by mouth 2 (two) times daily with a meal.    . levothyroxine (SYNTHROID, LEVOTHROID) 50 MCG tablet Take 50 mcg by mouth daily before breakfast.    . losartan (COZAAR) 50 MG tablet Take 50 mg by mouth 2 (two) times daily.    Marland Kitchen. NIFEdipine (PROCARDIA XL/ADALAT-CC) 90 MG 24 hr tablet Take 90 mg by mouth daily.     No current facility-administered medications for this visit.    Allergies: Sulfa antibiotics     Social History: The patient  reports that she has never smoked. She does not have any smokeless tobacco history on file. She reports that she drinks alcohol. She reports that she does not use illicit drugs.   Family History: The patient's family history includes Fainting in her mother; Heart attack in her father; Hypertension in her father; Stroke in her maternal grandfather.    ROS: Please see the history of present illness. Otherwise, review of systems is positive for back pain, chest pain, neurology headaches. All other systems are reviewed and negative.    PHYSICAL EXAM: VS: BP 172/90 mmHg  Pulse 64  Ht 5\' 5"  (1.651 m)  Wt 216 lb (97.977 kg)  BMI 35.94 kg/m2 , BMI Body mass index is 35.94 kg/(m^2). GEN: Well nourished, well developed, in no acute distress  HEENT: normal  Neck: no JVD, carotid bruits, or masses Cardiac: RRR; no rubs, or gallops,no edema, 2/6 systolic murmur at the base Respiratory: clear to auscultation bilaterally, normal work of breathing GI: soft, nontender, nondistended, + BS MS: no deformity or atrophy  Skin: warm and dry Neuro: Strength and sensation are intact Psych: euthymic mood, full affect  EKG: EKG is not ordered today. The ekg ordered 4/7 shows sinus rhythm, PRWP, inferior TWI  Recent Labs: 02/19/2016: BUN 27*; Creatinine, Ser 1.14*; Hemoglobin 10.1*; Platelets 168; Potassium 4.0; Sodium 141    Lipid Panel   Labs (Brief)    No results found for: CHOL, TRIG, HDL, CHOLHDL,  VLDL, LDLCALC, LDLDIRECT     Wt Readings from Last 3 Encounters:  03/23/16 216 lb (97.977 kg)  02/26/16 216 lb 3.2 oz (98.068 kg)   SPECT 03/06/16  Nuclear stress EF: 41%.  There was no ST segment deviation noted during stress.  This is an intermediate risk study.  The left ventricular ejection fraction is moderately decreased (30-44%).  ASSESSMENT AND PLAN:  1. Chest pain: SPECT with possible infarct vs diaphragm attenuation. Due to the fact  that she has a decreased EF on her SPECT, we'll get an echocardiogram to further delineate. She also has a murmur and that will help to determine the cause of her murmur. She is continuing to have chest discomfort that is similar to her prior episodes of chest discomfort. She had an intermediate risk study with an apical inferolateral perfusion defect. We'll therefore get a heart catheterization to see if she has any coronary disease that could explain her chest pain as well as her decreased EF.   Current medicines are reviewed at length with the patient today.  The patient does not have concerns regarding her medicines. The following changes were made today:   Labs/ tests ordered today include:  No orders of the defined types were placed in this encounter.    Disposition: FU with Will Camnitz post cath  Signed, Will Jorja Loa, MD  03/23/2016 2:58 PM        Data reviewed. Pt interviewed and evaluated. Intermediate risk stress study with decreased LVEF and fixed inferolateral defect. No clinical changes since this evaluation by Dr Elberta Fortis. Pt understands risks and indications for cath/PCI.  Tonny Bollman 04/23/2016 11:10 AM

## 2016-04-23 NOTE — Discharge Instructions (Signed)
Radial Site Care °Refer to this sheet in the next few weeks. These instructions provide you with information about caring for yourself after your procedure. Your health care provider may also give you more specific instructions. Your treatment has been planned according to current medical practices, but problems sometimes occur. Call your health care provider if you have any problems or questions after your procedure. °WHAT TO EXPECT AFTER THE PROCEDURE °After your procedure, it is typical to have the following: °· Bruising at the radial site that usually fades within 1-2 weeks. °· Blood collecting in the tissue (hematoma) that may be painful to the touch. It should usually decrease in size and tenderness within 1-2 weeks. °HOME CARE INSTRUCTIONS °· Take medicines only as directed by your health care provider. °· You may shower 24-48 hours after the procedure or as directed by your health care provider. Remove the bandage (dressing) and gently wash the site with plain soap and water. Pat the area dry with a clean towel. Do not rub the site, because this may cause bleeding. °· Do not take baths, swim, or use a hot tub until your health care provider approves. °· Check your insertion site every day for redness, swelling, or drainage. °· Do not apply powder or lotion to the site. °· Do not flex or bend the affected arm for 24 hours or as directed by your health care provider. °· Do not push or pull heavy objects with the affected arm for 24 hours or as directed by your health care provider. °· Do not lift over 10 lb (4.5 kg) for 5 days after your procedure or as directed by your health care provider. °· Ask your health care provider when it is okay to: °¨ Return to work or school. °¨ Resume usual physical activities or sports. °¨ Resume sexual activity. °· Do not drive home if you are discharged the same day as the procedure. Have someone else drive you. °· You may drive 24 hours after the procedure unless otherwise  instructed by your health care provider. °· Do not operate machinery or power tools for 24 hours after the procedure. °· If your procedure was done as an outpatient procedure, which means that you went home the same day as your procedure, a responsible adult should be with you for the first 24 hours after you arrive home. °· Keep all follow-up visits as directed by your health care provider. This is important. °SEEK MEDICAL CARE IF: °· You have a fever. °· You have chills. °· You have increased bleeding from the radial site. Hold pressure on the site. CALL 911 °SEEK IMMEDIATE MEDICAL CARE IF: °· You have unusual pain at the radial site. °· You have redness, warmth, or swelling at the radial site. °· You have drainage (other than a small amount of blood on the dressing) from the radial site. °· The radial site is bleeding, and the bleeding does not stop after 30 minutes of holding steady pressure on the site. °· Your arm or hand becomes pale, cool, tingly, or numb. °  °This information is not intended to replace advice given to you by your health care provider. Make sure you discuss any questions you have with your health care provider. °  °Document Released: 12/05/2010 Document Revised: 11/23/2014 Document Reviewed: 05/21/2014 °Elsevier Interactive Patient Education ©2016 [employer] Inc. ° °

## 2016-04-23 NOTE — Progress Notes (Signed)
  Spoke with Dr. Excell Seltzerooper in regards to Mrs. Smedley's home issues. She does not have transportation home nor does she have someone to observe her site, as her husband has left the hospital and cannot be reached via phone.  With her right radial cath site stable, she is good for discharge home from our perspective. Social work has been consulted in regards to transportation needs. The number to our on-call provider will be provided if she has any issues regarding her cath site.  Signed, Ellsworth LennoxBrittany M Benson Porcaro, PA-C 04/23/2016, 4:24 PM Pager: [phone]

## 2016-04-24 ENCOUNTER — Encounter (HOSPITAL_COMMUNITY): Payer: Self-pay | Admitting: Cardiovascular Disease

## 2016-05-22 ENCOUNTER — Telehealth: Payer: Self-pay | Admitting: Cardiology

## 2016-05-22 DIAGNOSIS — M79601 Pain in right arm: Secondary | ICD-10-CM

## 2016-05-22 NOTE — Telephone Encounter (Signed)
Patient calls in c/o right arm pain since cath last month. States she can't even lift it, she must use her left arm to raise her right arm. Advised we will order a arterial ultrasound of that arm to rule out fistula. She understands someone will call her to arrange this. We will determine next step in plan of care depending on results of US. She is agreeable to plan.

## 2016-05-22 NOTE — Telephone Encounter (Signed)
Pt wants to see Dr. Elberta Fortisamnitz today for extreme left arm pain-denies chest pain-pressure-SOB-vomiting-nausea-lightheadedness-dizziness

## 2016-05-25 ENCOUNTER — Ambulatory Visit: Payer: Medicare Other | Admitting: Neurology

## 2016-05-29 ENCOUNTER — Ambulatory Visit (HOSPITAL_COMMUNITY): Payer: Medicare Other | Attending: Cardiology

## 2016-05-29 ENCOUNTER — Ambulatory Visit (HOSPITAL_COMMUNITY): Admission: RE | Admit: 2016-05-29 | Payer: Medicare Other | Source: Ambulatory Visit

## 2016-05-29 ENCOUNTER — Other Ambulatory Visit: Payer: Self-pay

## 2016-05-29 DIAGNOSIS — I071 Rheumatic tricuspid insufficiency: Secondary | ICD-10-CM | POA: Diagnosis not present

## 2016-05-29 DIAGNOSIS — I119 Hypertensive heart disease without heart failure: Secondary | ICD-10-CM | POA: Insufficient documentation

## 2016-05-29 DIAGNOSIS — I340 Nonrheumatic mitral (valve) insufficiency: Secondary | ICD-10-CM | POA: Insufficient documentation

## 2016-05-29 DIAGNOSIS — R079 Chest pain, unspecified: Secondary | ICD-10-CM | POA: Diagnosis present

## 2016-05-29 DIAGNOSIS — I350 Nonrheumatic aortic (valve) stenosis: Secondary | ICD-10-CM | POA: Insufficient documentation

## 2016-05-29 DIAGNOSIS — R0989 Other specified symptoms and signs involving the circulatory and respiratory systems: Secondary | ICD-10-CM

## 2016-06-02 ENCOUNTER — Telehealth: Payer: Self-pay | Admitting: *Deleted

## 2016-06-02 ENCOUNTER — Other Ambulatory Visit: Payer: Self-pay | Admitting: *Deleted

## 2016-06-02 MED ORDER — LOSARTAN POTASSIUM 100 MG PO TABS: 100.0000 mg | ORAL_TABLET | Freq: Two times a day (BID) | ORAL | Status: AC

## 2016-06-02 MED ORDER — CARVEDILOL 25 MG PO TABS: 25.0000 mg | ORAL_TABLET | Freq: Two times a day (BID) | ORAL | Status: AC

## 2016-06-02 NOTE — Telephone Encounter (Signed)
Patient requested the following refills. Please advise on losartan dose as patient reports that she takes 100 mg bid which is reflected on the snapshot, but the note from her recent cath has 50 mg bid. I made her aware that I was not sure if Dr Elberta Fortisamnitz would fill the levothyroxine. Ok to refill? Please advise. Thanks, MI

## 2016-06-02 NOTE — Telephone Encounter (Signed)
Spoke with patient and made her aware of the following note that Dr Elberta Fortisamnitz would not refill the levothyroxine and that he would only refill the losartan and carvedilol for three months and that she would need to obtain further refills from her pcp or ordering prescriber. She stated "well then that means that I will have to go [place] a doctor to get refills?" "Thats fine, I will just call them then."  Call Documentation      Baird LyonsSherri L Price, RN at 06/02/2016 1:38 PM     Status: Signed       Expand All Collapse All   Losartan is correct at 100 mg BID. Dr. Elberta Fortisamnitz increased this secondary to BP. We can fill Losartan and Coreg for 3 month, but not more after that (she is PRN follow up). We did not start any of these medications - she was taking these [facility]. She will need to get further refills from her PCP or ordering prescriber.. We will not fill levothyroxine.

## 2016-06-02 NOTE — Telephone Encounter (Signed)
Losartan is correct at 100 mg BID.  Dr. Elberta Fortisamnitz increased this secondary to BP. We can fill Losartan and Coreg for 3 month, but not more after that (she is PRN follow up).  We did not start any of these medications - she was taking these prior to [facility].  She will need to get further refills from her PCP or ordering prescriber.. We will not fill levothyroxine.

## 2016-06-03 ENCOUNTER — Ambulatory Visit: Payer: Medicare Other | Admitting: Cardiology

## 2016-06-03 ENCOUNTER — Encounter (HOSPITAL_COMMUNITY): Payer: Self-pay | Admitting: Cardiology

## 2016-06-10 ENCOUNTER — Telehealth: Payer: Self-pay

## 2016-06-10 NOTE — Telephone Encounter (Signed)
[facility] in [place] called requesting coreg refill. I explained that in previous refill it said get further refills from PCP or ordering prescriber and pharmacist said she would let patient know. I explained to call back if they still needed help and I would send message to doctor to see if refill could be approved. Pharmacist thanked me and stated her understanding.   Medication Detail    Disp Refills Start End   carvedilol (COREG) 25 MG tablet 180 tablet 0 06/02/2016    Sig - Route: Take 1 tablet (25 mg total) by mouth 2 (two) times daily with a meal. Further refills from pcp or original prescriber - Oral   E-Prescribing Status: Receipt confirmed by pharmacy (06/02/2016 2:25 PM EDT)

## 2016-06-12 ENCOUNTER — Ambulatory Visit: Payer: Medicare Other | Admitting: Neurology

## 2016-06-22 ENCOUNTER — Ambulatory Visit: Payer: Medicare Other | Admitting: Cardiology

## 2016-08-30 ENCOUNTER — Other Ambulatory Visit: Payer: Self-pay | Admitting: Cardiology

## 2017-01-31 IMAGING — DX DG CHEST 2V
2 series · 2 of 2 positions shown · non-contrast
Comparison: None.

CLINICAL DATA: Chest pain for 1 day.

EXAM:
CHEST  2 VIEW

[chest lat]
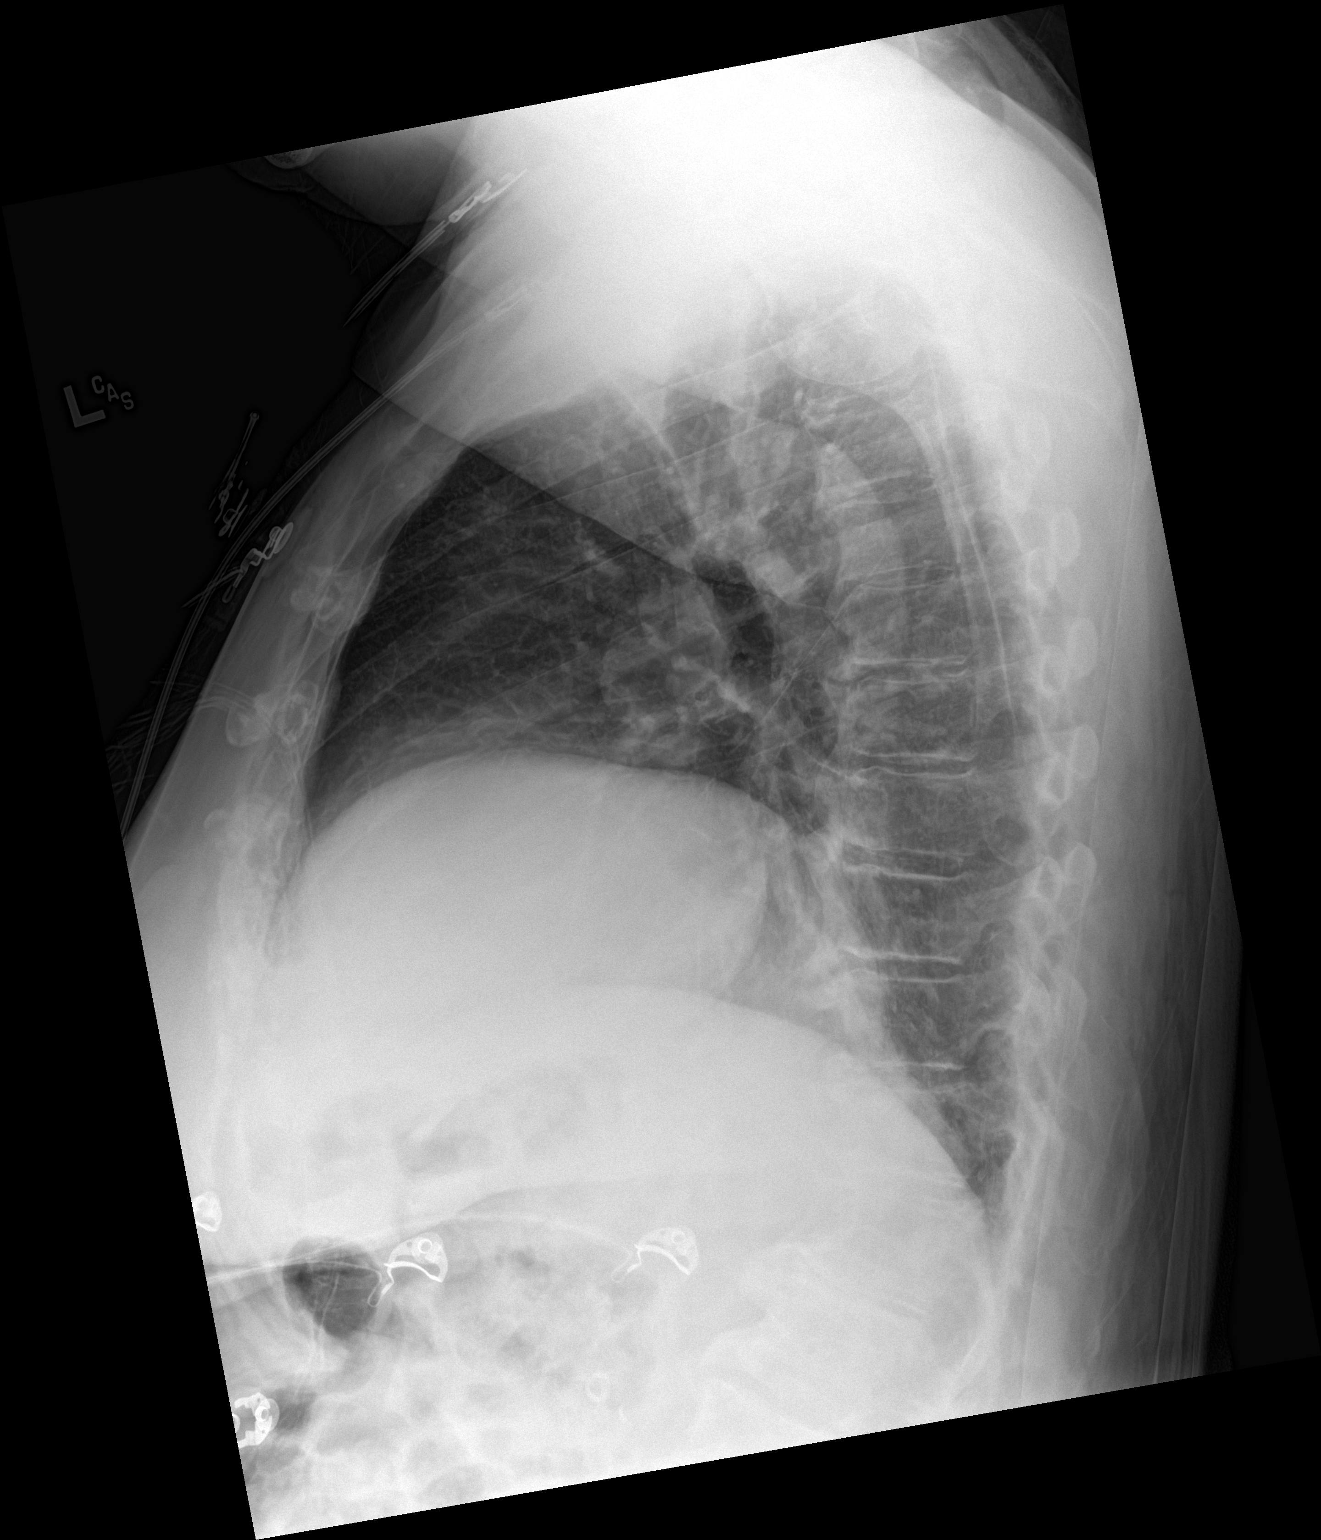

[chest ap]
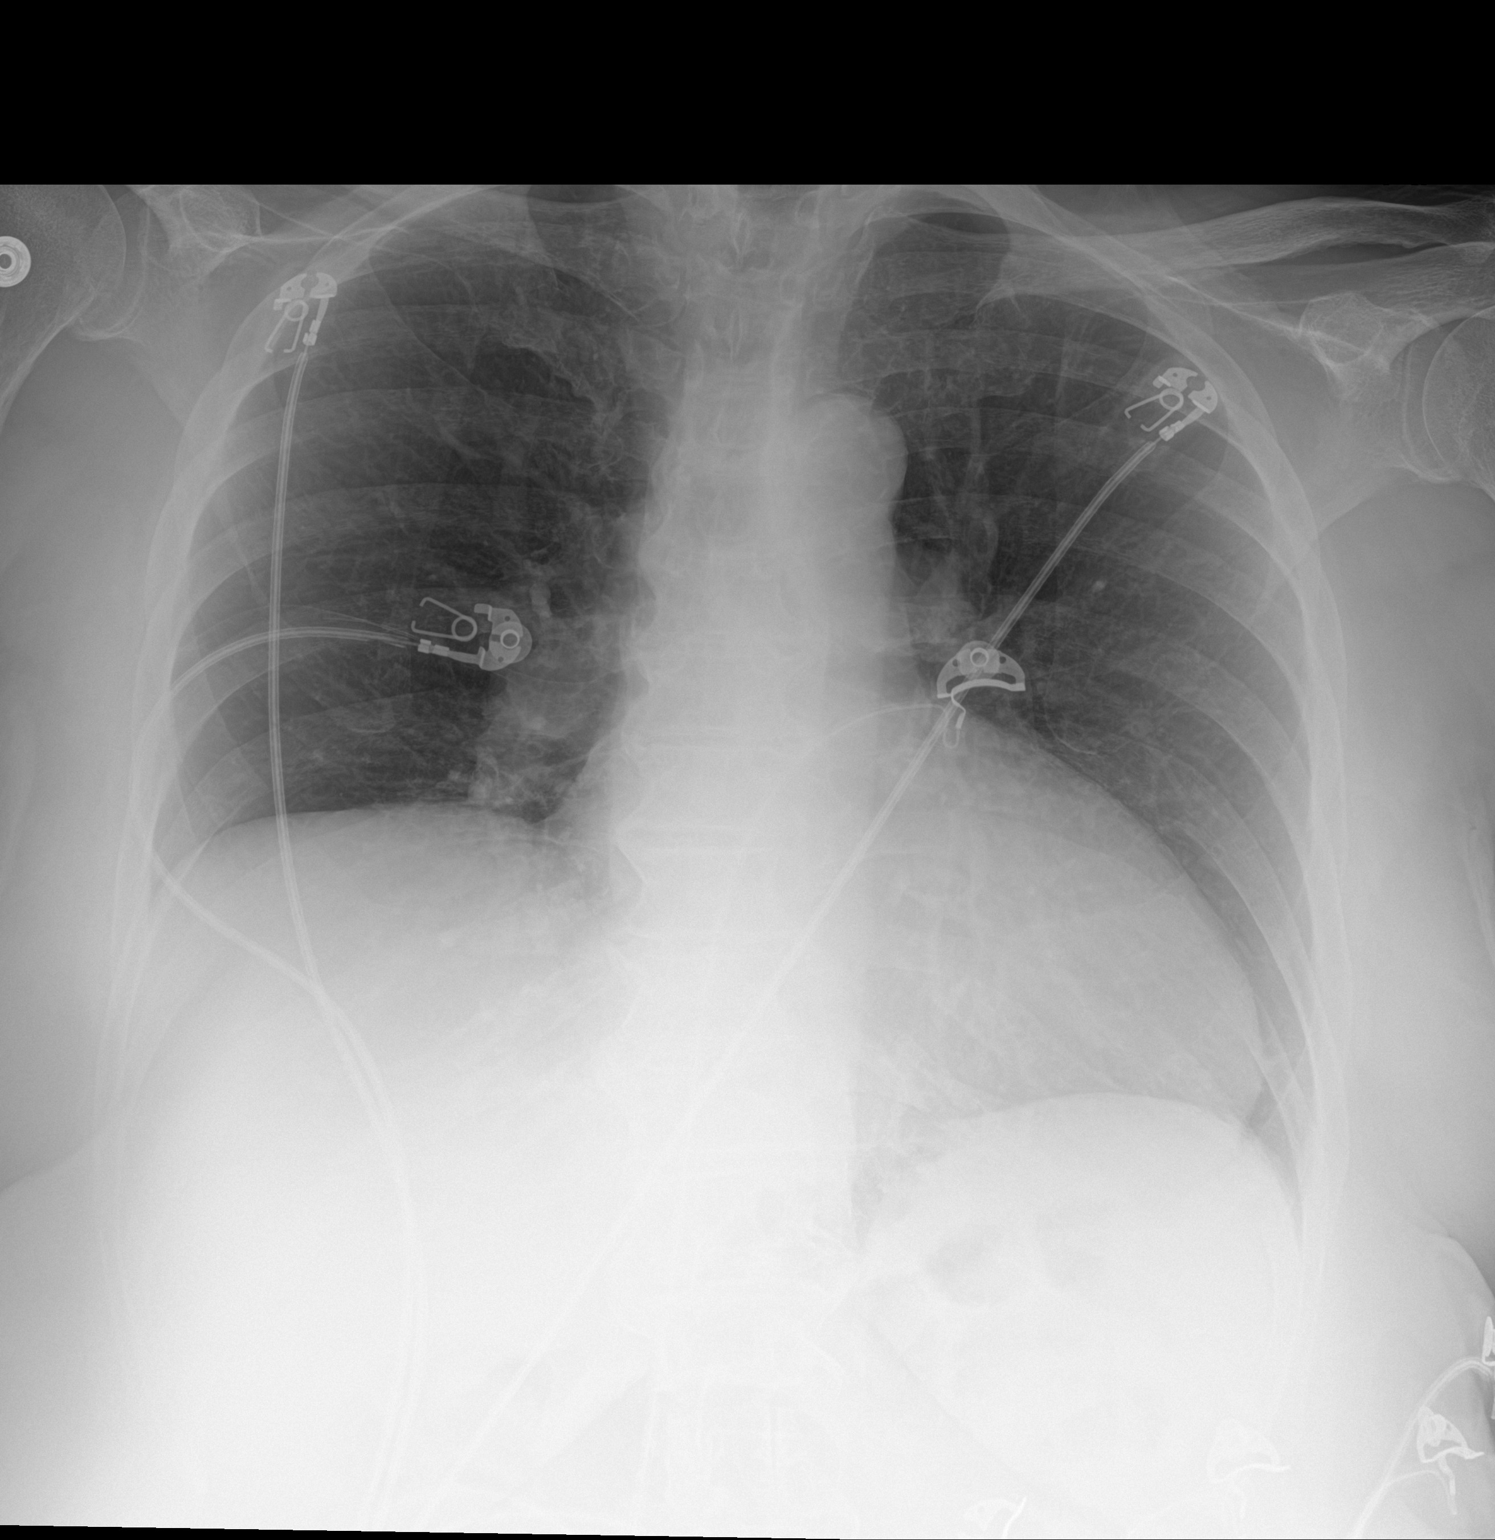

[2 of 2 positions shown; findings below may reference images not displayed]

FINDINGS: Elevation of right hemidiaphragm noted. Mild to moderate
cardiomegaly is demonstrated. Both lungs are clear. No evidence of
pneumothorax or pleural effusion.
IMPRESSION: Cardiomegaly and elevation of right hemidiaphragm. No active lung
disease.

## 2018-09-16 DEATH — deceased
